# Patient Record
Sex: Female | Born: 1965
Health system: Southern US, Community
[De-identification: ages and names within clinical notes are randomized; demographics above are authoritative.]

## PROBLEM LIST (undated history)

## (undated) DIAGNOSIS — M459 Ankylosing spondylitis of unspecified sites in spine: Secondary | ICD-10-CM

## (undated) DIAGNOSIS — I10 Essential (primary) hypertension: Secondary | ICD-10-CM

## (undated) DIAGNOSIS — Z8639 Personal history of other endocrine, nutritional and metabolic disease: Secondary | ICD-10-CM

---

## 2009-02-01 ENCOUNTER — Ambulatory Visit (HOSPITAL_COMMUNITY): Admission: RE | Admit: 2009-02-01 | Discharge: 2009-02-02 | Payer: Self-pay | Admitting: Neurosurgery

## 2009-02-24 ENCOUNTER — Emergency Department (HOSPITAL_BASED_OUTPATIENT_CLINIC_OR_DEPARTMENT_OTHER): Admission: EM | Admit: 2009-02-24 | Discharge: 2009-02-24 | Payer: Self-pay | Admitting: Emergency Medicine

## 2009-02-24 ENCOUNTER — Ambulatory Visit: Payer: Self-pay | Admitting: Diagnostic Radiology

## 2009-04-11 ENCOUNTER — Encounter: Admission: RE | Admit: 2009-04-11 | Discharge: 2009-04-11 | Payer: Self-pay | Admitting: Neurosurgery

## 2010-12-23 LAB — BASIC METABOLIC PANEL
Calcium: 9.6 mg/dL (ref 8.4–10.5)
GFR calc Af Amer: >60 mL/min (ref >60)
GFR calc non Af Amer: >60 mL/min (ref >60)
Glucose, Bld: 104 mg/dL — ABNORMAL HIGH (ref 70–99)
Potassium: 3.8 mEq/L (ref 3.5–5.1)
Sodium: 139 mEq/L (ref 135–145)

## 2010-12-23 LAB — DIFFERENTIAL
Basophils Absolute: 0.1 10*3/uL (ref 0.0–0.1)
Eosinophils Relative: 2 % (ref 0–5)
Lymphocytes Relative: 24 % (ref 12–46)
Lymphs Abs: 2 10*3/uL (ref 0.7–4.0)
Monocytes Absolute: 0.5 10*3/uL (ref 0.1–1.0)
Neutro Abs: 5.5 10*3/uL (ref 1.7–7.7)

## 2010-12-23 LAB — CBC
HCT: 34.2 % — ABNORMAL LOW (ref 36.0–46.0)
Hemoglobin: 11.6 g/dL — ABNORMAL LOW (ref 12.0–15.0)
RBC: 4.19 MIL/uL (ref 3.87–5.11)
RDW: 15.8 % — ABNORMAL HIGH (ref 11.5–15.5)
WBC: 8.1 10*3/uL (ref 4.0–10.5)

## 2010-12-23 LAB — TYPE AND SCREEN

## 2010-12-23 LAB — ABO/RH: ABO/RH(D): O POS

## 2011-01-27 NOTE — Op Note (Signed)
NAMECOURTNY, Sherry Potter             ACCOUNT NO.:  [id number]   MEDICAL RECORD NO.:  [id number]          PATIENT TYPE:  OIB   LOCATION:  3533                         FACILITY:  [facility]   PHYSICIAN:  Kathaleen Maser. Pool, M.D.    DATE OF BIRTH:  1966/08/01   DATE OF PROCEDURE:  02/01/2009  DATE OF DISCHARGE:                               OPERATIVE REPORT   PREOPERATIVE DIAGNOSIS:  C5-6 herniated nucleus pulposus with stenosis  and myelopathy/radiculopathy.   POSTOPERATIVE DIAGNOSIS:  C5-6 herniated nucleus pulposus with stenosis  and myelopathy/radiculopathy.   PROCEDURE NAME:  C5-6 anterior cervical diskectomy and fusion with  allograft and plating.   SURGEON:  Kathaleen Maser. Pool, MD   ASSISTANT:  Tia Alert, MD   ANESTHESIA:  General orotracheal.   INDICATION:  Sherry Potter is a 45 year old female with history of neck  pain and bilateral upper extremity symptoms, left greater than right.  Workup demonstrates evidence of a large central disk herniation at C5-6  with spinal cord compression and kyphotic angulation.  The patient  presents now for C5-6 anterior cervical diskectomy fusion with allograft  and plating and hopes improving her symptoms.   OPERATIVE NOTE:  The patient was brought to the operating room and  placed on the operative table in a supine position.  After adequate  level of anesthesia was achieved, the patient was positioned supine with  neck slightly extended up with halter traction.  The patient's anterior  cervical region was prepped and draped sterilely.  A #10 blade was used  to make an curvilinear incision overlying the C5-6 interspace.  This  carried down sharply to the platysma.  Platysma was then divided  vertically and dissection proceeded along the medial border of the  sternomastoid muscle and carotid sheath.  Trachea and esophagus were  mobilized and tracked towards the left.  Prevertebral fascia stripped  off the anterior spinal column.  Longus colli muscle  was then elevated  bilaterally using electrocautery.  Deep self-retaining retractor was  placed.  Intraoperative fluoroscopy was used and C5-6 level was  confirmed.  Disk space then incised with 15 blade in a rectangular  fashion.  A wide disk space clean-out was achieved using pituitary  rongeurs, forward and backward Karlin curettes, Kerrison rongeurs, and  high-speed drill.  Elements of the disk removed down to the level of the  posterior annulus.  Microscope was then brought to field and used at the  remainder of the diskectomy.  Remaining aspects annulus and osteophytes  removed using high-speed drill down to the level of the posterolateral  ligament.  The posterolateral ligament was then elevated and resected in  piecemeal fashion using Kerrison rongeurs where thecal sac was  identified.  Wide central decompression was then performed by  undercutting the bodies of C5 and C6.  Decompression had been achieved  at neural foramen.  Wide anterior foraminotomies were then performed  along course of the exiting C6 nerve roots bilaterally.  At this point a  very thorough decompression was achieved.  There is no evidence of  injury to thecal sac or nerve roots.  Wound  was then irrigated with  antibiotic solution.  Gelfoam was placed topically for hemostasis, which  found to be good.  Microscope was then used as a 7-mm allograft wedge,  was then packed into place and recessed approximately 1 mm from the  anterior cortical margin.  A 25 mL Atlantis anterior plate was then  placed over the C5-C6 levels.  This was then attached under fluoroscopic  guidance.  Variable angle screws of 13-mm at both levels.  All 4 screws  were given final tightening.  Locking screws were engaged in both  levels.  Final images revealed good position of the bone  graft, hardware at operative level, and normal spine.  Wound was then  inspected for hemostasis, which was found to be good.  I then closed in  typical  fashion.  Steri-Strips and sterile dressings were applied.  The  patient tolerated the procedure well and she returned to recovery room  postoperatively.           ______________________________  Kathaleen Maser Pool, M.D.     HAP/MEDQ  D:  02/01/2009  T:  02/01/2009  Job:  [id number]

## 2019-08-28 ENCOUNTER — Emergency Department (HOSPITAL_BASED_OUTPATIENT_CLINIC_OR_DEPARTMENT_OTHER)
Admission: EM | Admit: 2019-08-28 | Discharge: 2019-08-28 | Disposition: A | Discharge status: Home / Self Care | Payer: BC Managed Care – PPO | Attending: Emergency Medicine | Admitting: Emergency Medicine

## 2019-08-28 ENCOUNTER — Encounter (HOSPITAL_BASED_OUTPATIENT_CLINIC_OR_DEPARTMENT_OTHER): Payer: Self-pay | Admitting: *Deleted

## 2019-08-28 ENCOUNTER — Emergency Department (HOSPITAL_BASED_OUTPATIENT_CLINIC_OR_DEPARTMENT_OTHER): Payer: BC Managed Care – PPO

## 2019-08-28 ENCOUNTER — Other Ambulatory Visit: Payer: Self-pay

## 2019-08-28 DIAGNOSIS — I10 Essential (primary) hypertension: Secondary | ICD-10-CM | POA: Insufficient documentation

## 2019-08-28 DIAGNOSIS — M5431 Sciatica, right side: Secondary | ICD-10-CM | POA: Insufficient documentation

## 2019-08-28 DIAGNOSIS — Z79899 Other long term (current) drug therapy: Secondary | ICD-10-CM | POA: Diagnosis not present

## 2019-08-28 DIAGNOSIS — M5432 Sciatica, left side: Secondary | ICD-10-CM | POA: Insufficient documentation

## 2019-08-28 DIAGNOSIS — M549 Dorsalgia, unspecified: Secondary | ICD-10-CM | POA: Diagnosis present

## 2019-08-28 DIAGNOSIS — M5441 Lumbago with sciatica, right side: Secondary | ICD-10-CM

## 2019-08-28 HISTORY — DX: Ankylosing spondylitis of unspecified sites in spine: M45.9

## 2019-08-28 HISTORY — DX: Personal history of other endocrine, nutritional and metabolic disease: Z86.39

## 2019-08-28 HISTORY — DX: Essential (primary) hypertension: I10

## 2019-08-28 MED ORDER — HYDROCODONE-ACETAMINOPHEN 5-325 MG PO TABS: 1.0000 | ORAL_TABLET | Freq: Four times a day (QID) | ORAL | 0 refills | Status: AC | PRN

## 2019-08-28 MED ORDER — HYDROCODONE-ACETAMINOPHEN 5-325 MG PO TABS
1.0000 | ORAL_TABLET | Freq: Once | ORAL | Status: AC
  Administered 2019-08-28: 1 via ORAL
  Filled 2019-08-28: qty 1

## 2019-08-28 MED ORDER — DIAZEPAM 5 MG PO TABS
10.0000 mg | ORAL_TABLET | Freq: Once | ORAL | Status: AC
  Administered 2019-08-28: 10 mg via ORAL
  Filled 2019-08-28: qty 2

## 2019-08-28 MED ORDER — KETOROLAC TROMETHAMINE 30 MG/ML IJ SOLN
15.0000 mg | Freq: Once | INTRAMUSCULAR | Status: AC
  Administered 2019-08-28: 09:00:00 15 mg via INTRAMUSCULAR
  Filled 2019-08-28: qty 1

## 2019-08-28 MED ORDER — DEXAMETHASONE 6 MG PO TABS
10.0000 mg | ORAL_TABLET | Freq: Once | ORAL | Status: AC
  Administered 2019-08-28: 10 mg via ORAL
  Filled 2019-08-28: qty 1

## 2019-08-28 MED FILL — HYDROCODON-APAP 5-325: 5-325 | 3 days supply | Qty: 10 | Fill #0

## 2019-08-28 NOTE — ED Provider Notes (Signed)
[facility] EMERGENCY DEPARTMENT Provider Note   CSN: [id number] Arrival date & time: 08/28/19  0759     History Chief Complaint  Patient presents with  . Back Pain    Sherry Potter is a 53 y.o. female.  53 year old female with past medical history including hypertension, ankylosing spondylitis, Hashimoto thyroiditis who presents with back pain.  Patient was diagnosed with AS in March and follows with a rheumatologist.  She is on Humira and received last dose last week.  She also follows with her family medicine doctor for ongoing problems with back pain and intermittent bilateral leg numbness down to the feet.  Her PCP had ordered MRIs in October to evaluate back pain as well as to rule out MS, however she has run into problems due to cost of co-pay and has not yet had this imaging.  She reports that 2 days ago, her back pain became much worse and she has had shooting pains down the backs of her legs, right worse than left. She notes that pain is across lower back. She also reports numbness in her feet.  She denies any bowel/bladder problems although she tried taking castor oil yesterday for possible constipation which resulted in normal BM. Pain is worse w/ standing up and movements. No change in physical activity. She took 500mg  ibuprofen a few hours ago without relief. She feels like current symptoms are different from usual back issues because back pain is happening at the same time as foot numbness, whereas usually the foot numbness happens in isolation. Denies fevers or recent illness.  The history is provided by the patient.  Back Pain      Past Medical History:  Diagnosis Date  . Hx of Hashimoto thyroiditis   . Hypertension   . Spondylitis, ankylosing (HCC)     There are no problems to display for this patient.   Past Surgical History:  Procedure Laterality Date  . CESAREAN SECTION    . NECK SURGERY    . THYROIDECTOMY, PARTIAL       OB History    Gravida  4   Para  4   Term      Preterm      AB      Living        SAB      TAB      Ectopic      Multiple      Live Births              History reviewed. No pertinent family history.  Social History   Tobacco Use  . Smoking status: Never Smoker  . Smokeless tobacco: Never Used  Substance Use Topics  . Alcohol use: Never  . Drug use: Never    Home Medications Prior to Admission medications   Medication Sig Start Date End Date Taking? Authorizing Provider  diphenhydrAMINE (BENADRYL) 25 MG tablet Take 25 mg by mouth every 6 (six) hours as needed.   Yes [provider]  triamterene-hydrochlorothiazide (MAXZIDE-25) 37.5-25 MG tablet Take 1 tablet by mouth daily.   Yes [provider]  Vitamin D, Ergocalciferol, (DRISDOL) 1.25 MG (50000 UT) CAPS capsule Take 50,000 Units by mouth every 7 (seven) days. Twice a week   Yes [provider]  Adalimumab (HUMIRA) 20 MG/0.2ML PSKT Inject 20 mg into the skin every 14 (fourteen) days.    [provider]  HYDROcodone-acetaminophen (NORCO/VICODIN) 5-325 MG tablet Take 1 tablet by mouth every 6 (six) hours as needed  for up to 5 days. 08/28/19 09/02/19  Grenda Lora, Wenda Overland, MD    Allergies    Iodine and Lisinopril  Review of Systems   Review of Systems  Musculoskeletal: Positive for back pain.    All other systems reviewed and are negative except that which was mentioned in HPI   Physical Exam Updated Vital Signs BP (!) 152/87 (BP Location: Right Arm)   Pulse (!) 102   Temp 99.1 F (37.3 C) (Oral)   Resp 16   Ht 5\' 4"  (1.626 m)   Wt 102.1 kg   LMP 08/15/2019   SpO2 100%   BMI 38.62 kg/m   Physical Exam Vitals and nursing note reviewed.  Constitutional:      Appearance: She is well-developed.     Comments: Tearful and very anxious  HENT:     Head: Normocephalic and atraumatic.  Eyes:     Conjunctiva/sclera: Conjunctivae normal.     Pupils: Pupils are equal, round,  and reactive to light.  Cardiovascular:     Rate and Rhythm: Normal rate and regular rhythm.     Heart sounds: Normal heart sounds. No murmur.  Pulmonary:     Effort: Pulmonary effort is normal.     Breath sounds: Normal breath sounds.  Abdominal:     General: Bowel sounds are normal. There is no distension.     Palpations: Abdomen is soft.     Tenderness: There is no abdominal tenderness.  Musculoskeletal:     Cervical back: Neck supple.     Right lower leg: No edema.     Left lower leg: No edema.     Comments: No focal tenderness of lower back  Skin:    General: Skin is warm and dry.  Neurological:     Mental Status: She is alert and oriented to person, place, and time.     Sensory: No sensory deficit.     Gait: Gait normal.     Deep Tendon Reflexes: Reflexes normal.     Comments: Fluent speech 5/5 strength b/l straight leg raise, 3/5 strength b/l dorsiflexion/plantarflexion however normal gait with no foot drop, able to ambulate independently  Psychiatric:        Mood and Affect: Mood is anxious. Affect is tearful.        Judgment: Judgment normal.     ED Results / Procedures / Treatments   Labs (all labs ordered are listed, but only abnormal results are displayed) Labs Reviewed - No data to display  EKG None  Radiology DG Lumbar Spine Complete  Result Date: 08/28/2019 CLINICAL DATA:  Low back and bilateral leg pain. History of ankylosing spondylitis. EXAM: LUMBAR SPINE - COMPLETE 4+ VIEW COMPARISON:  None. FINDINGS: Mild straightening of the normal lumbar lordosis. No acute bony findings or obvious changes of ankylosing spondylitis involving the lumbar vertebral bodies or facets. No pars defects. The visualized bony pelvis is intact. I do not see any evidence of sacroiliitis. IMPRESSION: Normal alignment and no acute bony findings. Early degenerative disc disease and facet disease in the lower lumbar spine. Normal appearing SI joints. Electronically Signed   By: Marijo Sanes M.D.   On: 08/28/2019 08:59    Procedures Procedures (including critical care time)  Medications Ordered in ED Medications  dexamethasone (DECADRON) tablet 10 mg (10 mg Oral Given 08/28/19 0901)  ketorolac (TORADOL) 30 MG/ML injection 15 mg (15 mg Intramuscular Given 08/28/19 0905)  diazepam (VALIUM) tablet 10 mg (10 mg Oral Given 08/28/19 0902)  HYDROcodone-acetaminophen (NORCO/VICODIN) 5-325 MG per tablet 1 tablet (1 tablet Oral Given 08/28/19 [id number])    ED Course  I have reviewed the triage vital signs and the nursing notes.  Pertinent imaging results that were available during my care of the patient were reviewed by me and considered in my medical decision making (see chart for details).    MDM Rules/Calculators/A&P     Pt tachycardic at triage however not tachycardic during my exam, very anxious. She was tearful during interview. Initially had b/l symmetric weakness of dorsi/plantarflexion of feet but able to demonstrate normal strength while walking with no foot drop and no abnormal gait. I reviewed chart w/ previous PCP visit from October which documents "chronic back problems," "central lower back pain" and "sometimes pain that radiates into her legs" as well as episodes of leg numbness. Although she describes symptoms as different today, symptoms seem very similar to previous visit. She reports that she has f/u scheduled w/ a neurologist which I feel is appropriate given ongoing symptoms. XR here show DDD but no XR findings of AS. I do feel MRI would be helpful but she has no symptoms suggesting cauda equina or infection that warrant emergent MRI through ED. Gave pt decadron, valium, toradol, and norco.  On reassessment, the patient was comfortable and stated that she felt much better.  She will follow up with PCP and I have also provided with [place] [facility] follow-up information.  I have discussed supportive measures for her symptoms and I have also extensively reviewed  return precautions with her.  She voiced understanding. Final Clinical Impression(s) / ED Diagnoses Final diagnoses:  Acute bilateral low back pain with bilateral sciatica    Rx / DC Orders ED Discharge Orders         Ordered    HYDROcodone-acetaminophen (NORCO/VICODIN) 5-325 MG tablet  Every 6 hours PRN     08/28/19 1128           Jisell Majer, Ambrose Finlandachel Morgan, MD 08/28/19 1530

## 2019-08-28 NOTE — ED Triage Notes (Signed)
Back pain radiating to bilateral lower legs but the right leg is worst than the left.  The pain gets worst since Saturday.  She was diagnosed with spondylitis since March 2020.

## 2019-09-11 ENCOUNTER — Encounter (HOSPITAL_COMMUNITY): Payer: Self-pay | Admitting: Emergency Medicine

## 2019-09-11 ENCOUNTER — Emergency Department (HOSPITAL_COMMUNITY): Payer: BC Managed Care – PPO

## 2019-09-11 ENCOUNTER — Other Ambulatory Visit: Payer: Self-pay

## 2019-09-11 ENCOUNTER — Emergency Department (HOSPITAL_COMMUNITY)
Admission: EM | Admit: 2019-09-11 | Discharge: 2019-09-11 | Disposition: A | Discharge status: Home / Self Care | Payer: BC Managed Care – PPO | Attending: Emergency Medicine | Admitting: Emergency Medicine

## 2019-09-11 DIAGNOSIS — Z79899 Other long term (current) drug therapy: Secondary | ICD-10-CM | POA: Insufficient documentation

## 2019-09-11 DIAGNOSIS — I10 Essential (primary) hypertension: Secondary | ICD-10-CM | POA: Diagnosis not present

## 2019-09-11 DIAGNOSIS — M545 Low back pain, unspecified: Secondary | ICD-10-CM

## 2019-09-11 DIAGNOSIS — M5126 Other intervertebral disc displacement, lumbar region: Secondary | ICD-10-CM | POA: Insufficient documentation

## 2019-09-11 LAB — CBG MONITORING, ED: Glucose-Capillary: 99 mg/dL (ref 70–99)

## 2019-09-11 MED ORDER — OXYCODONE-ACETAMINOPHEN 5-325 MG PO TABS: 1.0000 | ORAL_TABLET | ORAL | 0 refills | Status: AC | PRN

## 2019-09-11 MED ORDER — PREDNISONE 10 MG PO TABS: ORAL_TABLET | ORAL | 0 refills | Status: AC

## 2019-09-11 MED FILL — predniSONE 10 MG TABS: 10 | 6 days supply | Qty: 21 | Fill #0

## 2019-09-11 MED FILL — OXYCODONE-ACETAMINOPHEN 5-3: 5-325 | 3 days supply | Qty: 20 | Fill #0

## 2019-09-11 NOTE — ED Provider Notes (Signed)
[facility]-EMERGENCY DEPT Provider Note   CSN: [id number] Arrival date & time: 09/11/19  1212     History Chief Complaint  Patient presents with   Leg Pain    Sherry Potter is a 53 y.o. female.  Patient reports she has a history of ankylosing spondlitis patient reports she is being followed by a rheumatologist and she is scheduled to see a neurologist.  Reports she has been having increasing low back pain and increasing pain and numbness in her legs.  Patient reports the toes on both feet are now numb.  She reports her toes feel cold.  Patient was seen at [facility] on 1214 and had x-rays of her lumbar spine.  X-rays at that time were unremarkable.  Patient spoke to her rheumatologist today about the increasing pain and discomfort and was told that she should come to the emergency department to have an MRI.  Patient denies fever or chills she has not had any nausea or vomiting she denies any abdominal pain.  Patient has pain in the lower aspect of her lumbar spine which radiates to buttocks posterior thighs and into her calf muscles.  She reports normal sensation except for her toes  The history is provided by the patient. A language interpreter was used.       Past Medical History:  Diagnosis Date   Hx of Hashimoto thyroiditis    Hypertension    Spondylitis, ankylosing (HCC)     There are no problems to display for this patient.   Past Surgical History:  Procedure Laterality Date   CESAREAN SECTION     NECK SURGERY     THYROIDECTOMY, PARTIAL       OB History    Gravida  4   Para  4   Term      Preterm      AB      Living        SAB      TAB      Ectopic      Multiple      Live Births              No family history on file.  Social History   Tobacco Use   Smoking status: Never Smoker   Smokeless tobacco: Never Used  Substance Use Topics   Alcohol use: Never   Drug use: Never    Home  Medications Prior to Admission medications   Medication Sig Start Date End Date Taking? Authorizing Provider  Adalimumab (HUMIRA) 20 MG/0.2ML PSKT Inject 20 mg into the skin every 14 (fourteen) days.    [provider]  diphenhydrAMINE (BENADRYL) 25 MG tablet Take 25 mg by mouth every 6 (six) hours as needed.    [provider]  triamterene-hydrochlorothiazide (MAXZIDE-25) 37.5-25 MG tablet Take 1 tablet by mouth daily.    [provider]  Vitamin D, Ergocalciferol, (DRISDOL) 1.25 MG (50000 UT) CAPS capsule Take 50,000 Units by mouth every 7 (seven) days. Twice a week    [provider]    Allergies    Iodine and Lisinopril  Review of Systems   Review of Systems  All other systems reviewed and are negative.   Physical Exam Updated Vital Signs BP (!) 187/97    Pulse (!) 111    Temp 98.6 F (37 C) (Oral)    Resp 18    Ht 5\' 4"  (1.626 m)    Wt 102.1 kg  LMP 08/15/2019    SpO2 100%    BMI 38.62 kg/m   Physical Exam Vitals and nursing note reviewed.  Constitutional:      Appearance: She is well-developed.  HENT:     Head: Normocephalic.  Cardiovascular:     Rate and Rhythm: Normal rate and regular rhythm.  Pulmonary:     Effort: Pulmonary effort is normal.  Abdominal:     General: There is no distension.  Musculoskeletal:        General: Normal range of motion.     Cervical back: Normal range of motion.     Comments: Diffuse lumbar spine full range of motion lower extremities full range of motion toes.  Patient has good bilateral pulses.  She has decreased sensation to light touch in toes  Skin:    General: Skin is warm.  Neurological:     General: No focal deficit present.     Mental Status: She is alert and oriented to person, place, and time.  Psychiatric:        Mood and Affect: Mood normal.     ED Results / Procedures / Treatments   Labs (all labs ordered are listed, but only abnormal results are displayed) Labs Reviewed  CBG  MONITORING, ED    EKG None  Radiology MR LUMBAR SPINE WO CONTRAST  Result Date: 09/11/2019 CLINICAL DATA:  Low back pain radiating to both legs over the last month. EXAM: MRI LUMBAR SPINE WITHOUT CONTRAST TECHNIQUE: Multiplanar, multisequence MR imaging of the lumbar spine was performed. No intravenous contrast was administered. COMPARISON:  Lumbar radiography 08/28/2019. Chest radiography 01/29/2009. FINDINGS: Segmentation: Anatomy has transitional features. Using the chest radiograph, there are 12 sets of ribs with the right twelfth rib being diminutive. There are 4 lumbar type vertebral bodies with L5 being sacralized. Correlation with this numbering terminology would be important should intervention be contemplated. Alignment:  Straightening of the normal lumbar lordosis. Vertebrae:  No fracture or primary bone lesion. Conus medullaris and cauda equina: Conus extends to the T12-L1 level. Conus and cauda equina appear normal. Paraspinal and other soft tissues: Simple cyst of the left kidney. Leiomyoma of the uterus measuring at least 5 cm. Disc levels: No abnormality at T11-12 or T12-L1. L1-2: No disc abnormality. Mild facet hypertrophy. No compressive stenosis. L2-3: Disc degeneration with circumferential bulging. Facet and ligamentous hypertrophy. Spinal stenosis resulting in effacement of the subarachnoid space and crowding of the nerve roots. Neural compression is possible at this level. L3-4: Bulging of the disc more prominent towards the right. Facet and ligamentous hypertrophy. Spinal stenosis with effacement of the subarachnoid space and crowding of the nerve roots. Neural compression is possible this level, particularly on the right. Additionally, there is foraminal stenosis on the right that could focally compress the L3 nerve. L4-5: Broad-based disc herniation with caudal migration in the midline behind L5. Bilateral facet hypertrophy. Stenosis of both subarticular lateral recesses that could  cause neural compression on either or both sides. L5-S1: Rudimentary transitional level without pathologic finding. IMPRESSION: Assuming 12 sets of ribs, with the right twelfth rib being diminutive, there are 4 lumbar type vertebral bodies with a sacralized L5 segment. Severe multifactorial spinal stenosis at L2-3 and L3-4 which could cause neural compression. At L3-4, this is more pronounced on the right due to asymmetric protrusion of the disc. There is also right foraminal stenosis at L3-4 that could focally affect the right L3 nerve. L4-5 central disc herniation with caudal migration. Stenosis of the subarticular lateral  recesses that could additionally cause neural compression. Electronically Signed   By: Paulina Fusi M.D.   On: 09/11/2019 14:51    Procedures Procedures (including critical care time)  Medications Ordered in ED Medications - No data to display  ED Course  I have reviewed the triage vital signs and the nursing notes.  Pertinent labs & imaging results that were available during my care of the patient were reviewed by me and considered in my medical decision making (see chart for details).    MDM Rules/Calculators/A&P                      MDM: CBG is 99.  MRI shows hnp at l4-l5 and pt has severe spinal stenosis  Final Clinical Impression(s) / ED Diagnoses Final diagnoses:  Acute bilateral low back pain without sciatica  Hypertension, unspecified type    Rx / DC Orders ED Discharge Orders         Ordered    predniSONE (DELTASONE) 10 MG tablet     09/11/19 1524    oxyCODONE-acetaminophen (PERCOCET) 5-325 MG tablet  Every 4 hours PRN     09/11/19 1524        An After Visit Summary was printed and given to the patient.    Elson Areas, [place] 09/11/19 1527    Terrilee Files, MD 09/11/19 [phone]

## 2019-09-11 NOTE — ED Notes (Signed)
Patient given heat packs. 

## 2019-09-11 NOTE — ED Notes (Signed)
ED Provider at bedside. 

## 2019-09-11 NOTE — ED Triage Notes (Signed)
Pt complaint of bilateral leg pain, worse on right. Hx of arthritis and on humera for pain relief.

## 2019-09-11 NOTE — ED Notes (Addendum)
Patient transported to MRI 

## 2020-09-06 IMAGING — MR MR LUMBAR SPINE W/O CM
4 of 5 series · 18 of 48 positions shown · non-contrast
Comparison: Lumbar radiography 08/28/2019. Chest radiography
01/29/2009.

CLINICAL DATA: Low back pain radiating to both legs over the last
month.

EXAM:
MRI LUMBAR SPINE WITHOUT CONTRAST
TECHNIQUE: Multiplanar, multisequence MR imaging of the lumbar spine was
performed. No intravenous contrast was administered.

[Series 3: T1 · sagittal · 4.0mm · 0.51mm/px · 3 of 12 slices shown (1 of 2)]
[im 3/12]
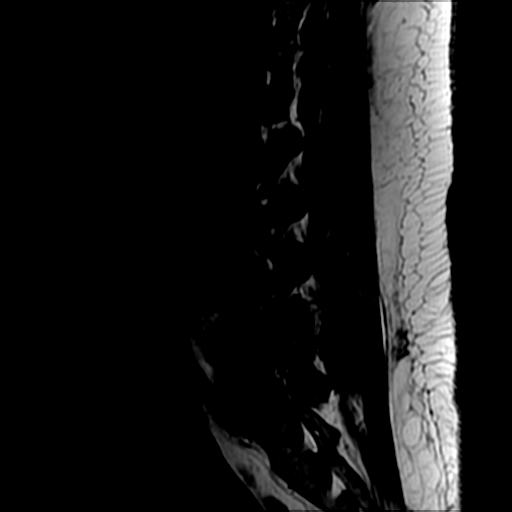
[im 7/12]
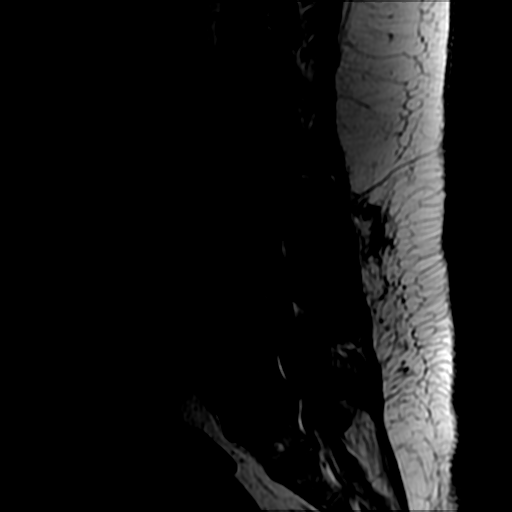
[im 12/12]
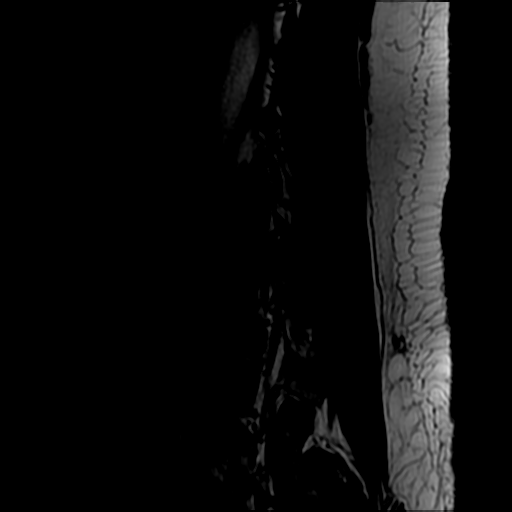

[Series 4: T2 post-contrast · sagittal · 4.0mm · 0.51mm/px · 5 of 12 slices shown]
[im 1/12]
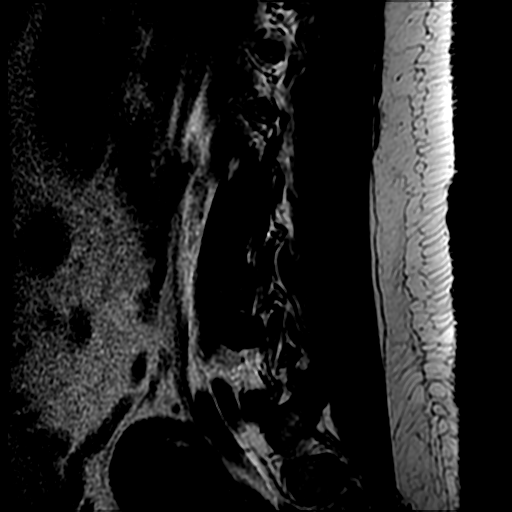
[im 3/12]
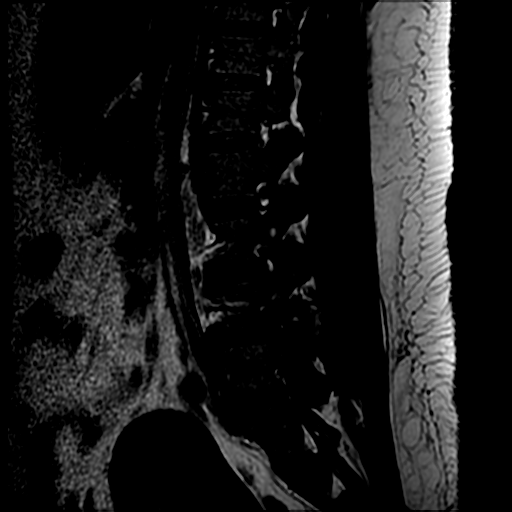
[im 6/12]
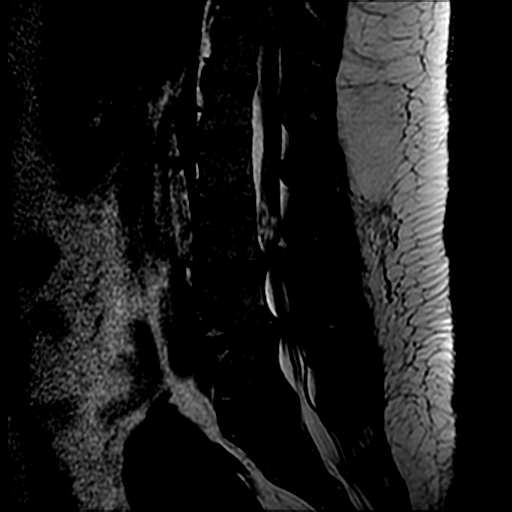
[im 9/12]
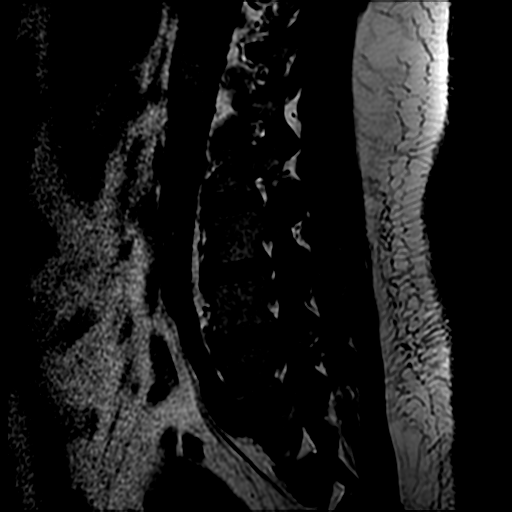
[im 12/12]
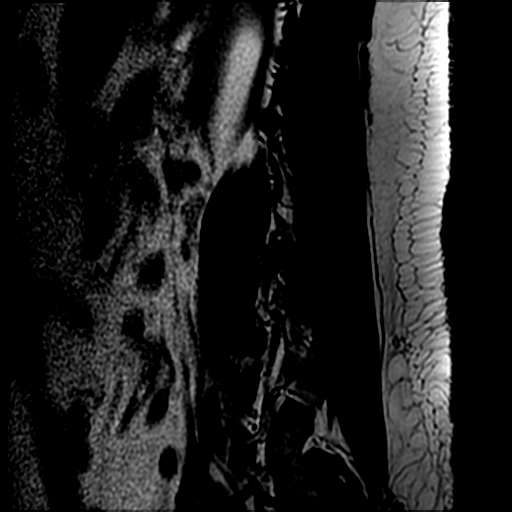

[Series 6: T2 · axial · 4.0mm · 0.43mm/px · z∈[-27,+147]mm · 7 of 37 slices shown]
[im 3/37]
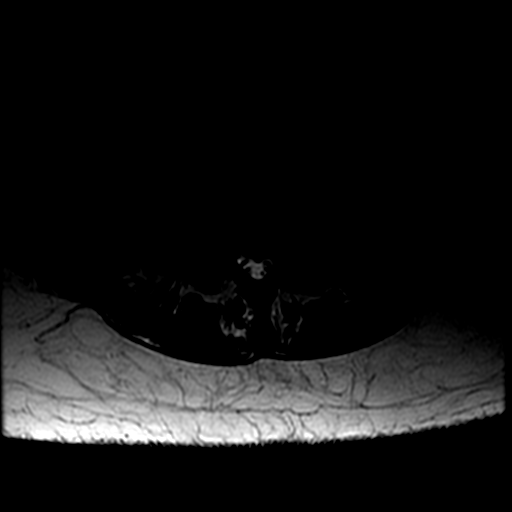
[im 5/37]
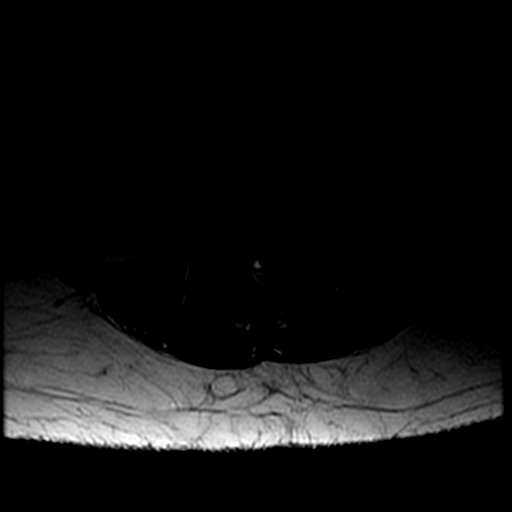
[im 8/37]
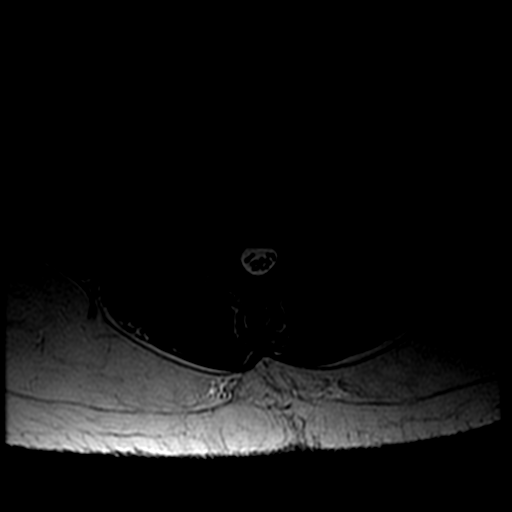
[im 13/37]
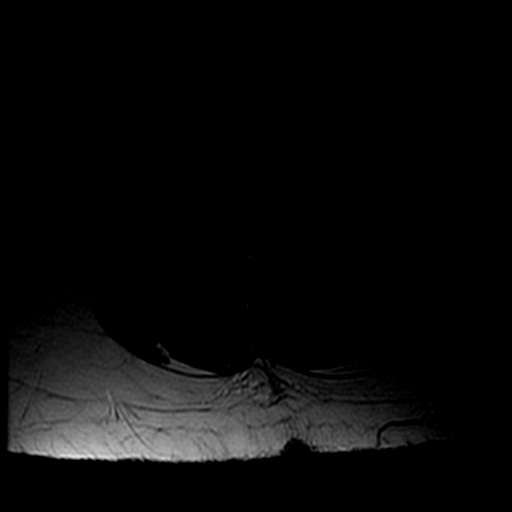
[im 17/37]
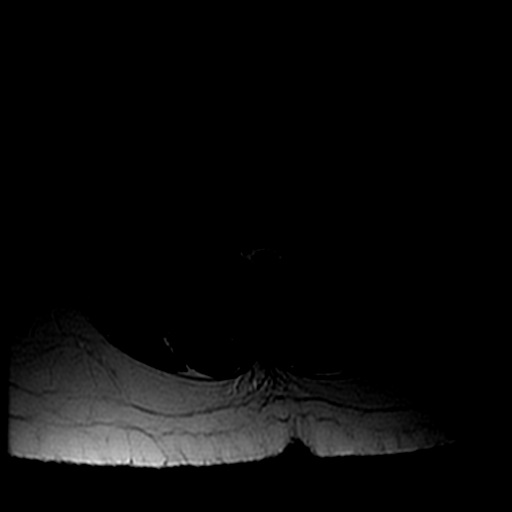
[im 20/37]
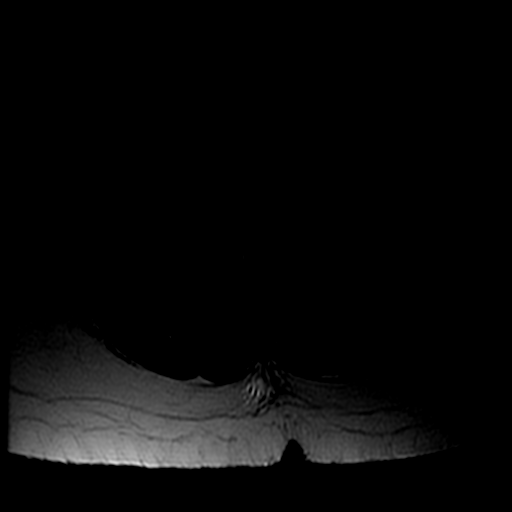
[im 32/37]
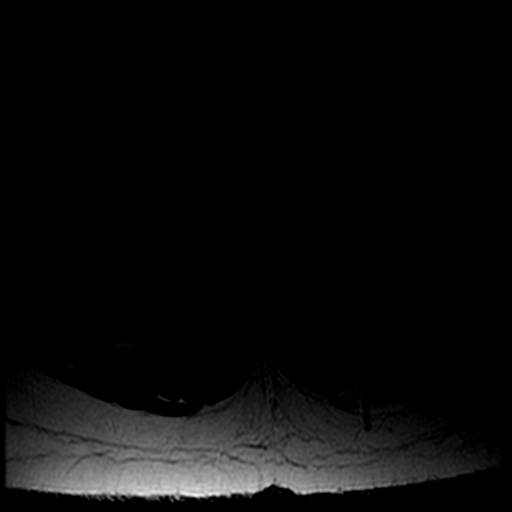

[Series 7: T1 · axial · 4.0mm · 0.43mm/px · z∈[-17,+147]mm · 3 of 37 slices shown (2 of 2)]
[im 5/37]
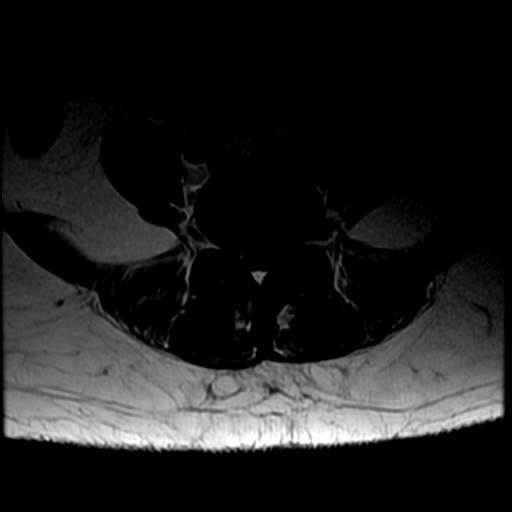
[im 20/37]
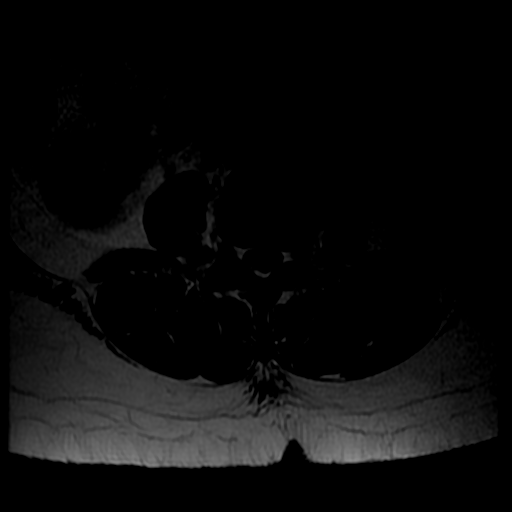
[im 32/37]
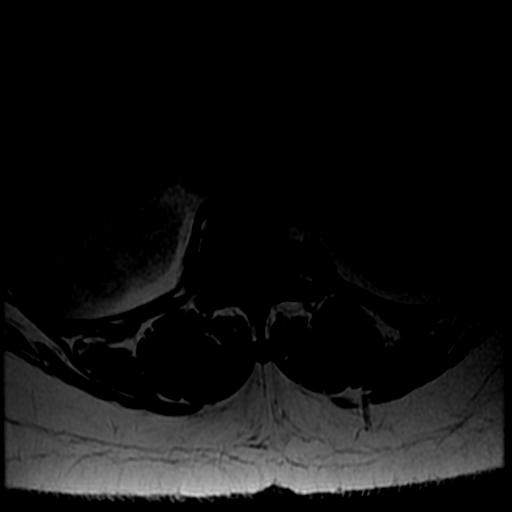

[18 of 48 positions shown; findings below may reference images not displayed]

FINDINGS: Segmentation: Anatomy has transitional features. Using the chest
radiograph, there are 12 sets of ribs with the right twelfth rib
being diminutive. There are 4 lumbar type vertebral bodies with L5
being sacralized. Correlation with this numbering terminology would
be important should intervention be contemplated.

Alignment:  Straightening of the normal lumbar lordosis.

Vertebrae:  No fracture or primary bone lesion.

Conus medullaris and cauda equina: Conus extends to the T12-L1
level. Conus and cauda equina appear normal.

Paraspinal and other soft tissues: Simple cyst of the left kidney.
Leiomyoma of the uterus measuring at least 5 cm.

Disc levels:

No abnormality at T11-12 or T12-L1.

L1-2: No disc abnormality. Mild facet hypertrophy. No compressive
stenosis.

L2-3: Disc degeneration with circumferential bulging. Facet and
ligamentous hypertrophy. Spinal stenosis resulting in effacement of
the subarachnoid space and crowding of the nerve roots. Neural
compression is possible at this level.

L3-4: Bulging of the disc more prominent towards the right. Facet
and ligamentous hypertrophy. Spinal stenosis with effacement of the
subarachnoid space and crowding of the nerve roots. Neural
compression is possible this level, particularly on the right.
Additionally, there is foraminal stenosis on the right that could
focally compress the L3 nerve.

L4-5: Broad-based disc herniation with caudal migration in the
midline behind L5. Bilateral facet hypertrophy. Stenosis of both
subarticular lateral recesses that could cause neural compression on
either or both sides.

L5-S1: Rudimentary transitional level without pathologic finding.
IMPRESSION: Assuming 12 sets of ribs, with the right twelfth rib being
diminutive, there are 4 lumbar type vertebral bodies with a
sacralized L5 segment.

Severe multifactorial spinal stenosis at L2-3 and L3-4 which could
cause neural compression. At L3-4, this is more pronounced on the
right due to asymmetric protrusion of the disc. There is also right
foraminal stenosis at L3-4 that could focally affect the right L3
nerve.

L4-5 central disc herniation with caudal migration. Stenosis of the
subarticular lateral recesses that could additionally cause neural
compression.
